# Patient Record
Sex: Male | Born: 1995 | Race: White | Hispanic: No | Marital: Single | Smoking: Never smoker
Health system: Southern US, Community
[De-identification: ages and names within clinical notes are randomized; demographics above are authoritative.]

## PROBLEM LIST (undated history)

## (undated) DIAGNOSIS — G2581 Restless legs syndrome: Secondary | ICD-10-CM

## (undated) HISTORY — PX: OTHER SURGICAL HISTORY: SHX169

## (undated) HISTORY — PX: CHOLECYSTECTOMY: SHX55

---

## 2016-02-02 DIAGNOSIS — Z1389 Encounter for screening for other disorder: Secondary | ICD-10-CM | POA: Diagnosis not present

## 2016-02-02 DIAGNOSIS — Z23 Encounter for immunization: Secondary | ICD-10-CM | POA: Diagnosis not present

## 2016-02-02 DIAGNOSIS — Z0000 Encounter for general adult medical examination without abnormal findings: Secondary | ICD-10-CM | POA: Diagnosis not present

## 2016-07-19 DIAGNOSIS — Z23 Encounter for immunization: Secondary | ICD-10-CM | POA: Diagnosis not present

## 2016-09-14 DIAGNOSIS — G2581 Restless legs syndrome: Secondary | ICD-10-CM | POA: Diagnosis not present

## 2016-09-14 DIAGNOSIS — G253 Myoclonus: Secondary | ICD-10-CM | POA: Diagnosis not present

## 2016-09-14 DIAGNOSIS — S43412A Sprain of left coracohumeral (ligament), initial encounter: Secondary | ICD-10-CM | POA: Diagnosis not present

## 2016-09-14 DIAGNOSIS — R3129 Other microscopic hematuria: Secondary | ICD-10-CM | POA: Diagnosis not present

## 2016-09-14 DIAGNOSIS — Z13220 Encounter for screening for lipoid disorders: Secondary | ICD-10-CM | POA: Diagnosis not present

## 2017-01-27 ENCOUNTER — Encounter: Payer: Self-pay | Admitting: Emergency Medicine

## 2017-01-27 ENCOUNTER — Emergency Department
Admission: EM | Admit: 2017-01-27 | Discharge: 2017-01-27 | Disposition: A | Discharge status: Home / Self Care | Payer: BLUE CROSS/BLUE SHIELD | Attending: Emergency Medicine | Admitting: Emergency Medicine

## 2017-01-27 ENCOUNTER — Emergency Department: Payer: BLUE CROSS/BLUE SHIELD

## 2017-01-27 DIAGNOSIS — N200 Calculus of kidney: Secondary | ICD-10-CM | POA: Insufficient documentation

## 2017-01-27 DIAGNOSIS — R319 Hematuria, unspecified: Secondary | ICD-10-CM | POA: Diagnosis not present

## 2017-01-27 DIAGNOSIS — R109 Unspecified abdominal pain: Secondary | ICD-10-CM | POA: Insufficient documentation

## 2017-01-27 DIAGNOSIS — K760 Fatty (change of) liver, not elsewhere classified: Secondary | ICD-10-CM | POA: Diagnosis not present

## 2017-01-27 HISTORY — DX: Restless legs syndrome: G25.81

## 2017-01-27 LAB — CBC
HEMATOCRIT: 45.9 % (ref 40.0–52.0)
HEMOGLOBIN: 15.5 g/dL (ref 13.0–18.0)
MCH: 29.6 pg (ref 26.0–34.0)
MCHC: 33.8 g/dL (ref 32.0–36.0)
MCV: 87.4 fL (ref 80.0–100.0)
Platelets: 222 10*3/uL (ref 150–440)
RBC: 5.24 MIL/uL (ref 4.40–5.90)
RDW: 14 % (ref 11.5–14.5)
WBC: 12.1 10*3/uL — ABNORMAL HIGH (ref 3.8–10.6)

## 2017-01-27 LAB — COMPREHENSIVE METABOLIC PANEL
ALBUMIN: 5.6 g/dL — AB (ref 3.5–5.0)
ALT: 78 U/L — ABNORMAL HIGH (ref 17–63)
ANION GAP: 10 (ref 5–15)
AST: 40 U/L (ref 15–41)
Alkaline Phosphatase: 65 U/L (ref 38–126)
BILIRUBIN TOTAL: 0.8 mg/dL (ref 0.3–1.2)
BUN: 15 mg/dL (ref 6–20)
CO2: 27 mmol/L (ref 22–32)
Calcium: 9.4 mg/dL (ref 8.9–10.3)
Chloride: 105 mmol/L (ref 101–111)
Creatinine, Ser: 0.85 mg/dL (ref 0.61–1.24)
GFR calc Af Amer: >60 mL/min (ref >60)
GFR calc non Af Amer: >60 mL/min (ref >60)
GLUCOSE: 92 mg/dL (ref 65–99)
POTASSIUM: 3.4 mmol/L — AB (ref 3.5–5.1)
SODIUM: 142 mmol/L (ref 135–145)
TOTAL PROTEIN: 8.8 g/dL — AB (ref 6.5–8.1)

## 2017-01-27 LAB — URINALYSIS, COMPLETE (UACMP) WITH MICROSCOPIC
BACTERIA UA: NONE SEEN
BILIRUBIN URINE: NEGATIVE
Glucose, UA: NEGATIVE mg/dL
KETONES UR: NEGATIVE mg/dL
Leukocytes, UA: NEGATIVE
NITRITE: NEGATIVE
PROTEIN: 30 mg/dL — AB
Specific Gravity, Urine: 1.021 (ref 1.005–1.030)
pH: 5 (ref 5.0–8.0)

## 2017-01-27 LAB — LIPASE, BLOOD: Lipase: 27 U/L (ref 11–51)

## 2017-01-27 LAB — CK: CK TOTAL: 309 U/L (ref 49–397)

## 2017-01-27 NOTE — ED Triage Notes (Signed)
Pt has lower left sided abd pain that radiates into groin that was uncomfortable all day and at 2300 started really hurting and has had 2 episodes of vomiting.

## 2017-01-27 NOTE — ED Provider Notes (Signed)
[facility] Emergency Department Provider Note   ____________________________________________   First MD Initiated Contact with Patient 01/27/17 0430     (approximate)  I have reviewed the triage vital signs and the nursing notes.   HISTORY  Chief Complaint Abdominal Pain    HPI Jared Stephens is a 21 y.o. male who comes into the hospital today with multiple episodes of sharp left mid abdomen pain. The patient reports it happened and then he vomited. It went away and then it occurred again. The patient reports that after it happened a second time he decided to come in and get checked out. He's had no fevers, chills, pain with urination. He said he did notice there was a black piece of sand in his urine after he gave the sample here. The patient had an episode of blood in his urine in March and had been diagnosed with rhabdo but hadn't had any problems with it since. When the pain comes at the 5-6 out of 10 but he has not had any pain since an hour after he gave his urine sample. The patient reports he has not been drinking much water and he does drink a lot of milk. He is here today for evaluation.   Past Medical History:  Diagnosis Date  . RLS (restless legs syndrome)     There are no active problems to display for this patient.   Past Surgical History:  Procedure Laterality Date  . CHOLECYSTECTOMY    . tear duct surgery      Prior to Admission medications   Not on File    Allergies Patient has no known allergies.  No family history on file.  Social History Social History  Substance Use Topics  . Smoking status: Never Smoker  . Smokeless tobacco: Not on file  . Alcohol use No    Review of Systems  Constitutional: No fever/chills Eyes: No visual changes. ENT: No sore throat. Cardiovascular: Denies chest pain. Respiratory: Denies shortness of breath. Gastrointestinal: abdominal pain, nausea, vomiting.  No diarrhea.  No  constipation. Genitourinary: Negative for dysuria. Musculoskeletal: Negative for back pain. Skin: Negative for rash. Neurological: Negative for headaches, focal weakness or numbness.   ____________________________________________   PHYSICAL EXAM:  VITAL SIGNS: ED Triage Vitals  Enc Vitals Group     BP 01/27/17 0023 (!) 146/89     Pulse Rate 01/27/17 0023 84     Resp 01/27/17 0023 19     Temp 01/27/17 0023 98.8 F (37.1 C)     Temp Source 01/27/17 0023 Oral     SpO2 01/27/17 0023 100 %     Weight 01/27/17 0025 270 lb (122.5 kg)     Height 01/27/17 0025 6' (1.829 m)     Head Circumference --      Peak Flow --      Pain Score 01/27/17 0023 3     Pain Loc --      Pain Edu? --      Excl. in GC? --     Constitutional: Alert and oriented. Well appearing and in no acute distress. Eyes: Conjunctivae are normal. PERRL. EOMI. Head: Atraumatic. Nose: No congestion/rhinnorhea. Mouth/Throat: Mucous membranes are moist.  Oropharynx non-erythematous. Cardiovascular: Normal rate, regular rhythm. Grossly normal heart sounds.  Good peripheral circulation. Respiratory: Normal respiratory effort.  No retractions. Lungs CTAB. Gastrointestinal: Soft and nontender. No distention. Positive bowel sounds Musculoskeletal: No lower extremity tenderness nor edema.   Neurologic:  Normal speech and language.  Skin:  Skin is warm, dry and intact. Marland Kitchen Psychiatric: Mood and affect are normal.   ____________________________________________   LABS (all labs ordered are listed, but only abnormal results are displayed)  Labs Reviewed  COMPREHENSIVE METABOLIC PANEL - Abnormal; Notable for the following:       Result Value   Potassium 3.4 (*)    Total Protein 8.8 (*)    Albumin 5.6 (*)    ALT 78 (*)    All other components within normal limits  CBC - Abnormal; Notable for the following:    WBC 12.1 (*)    All other components within normal limits  URINALYSIS, COMPLETE (UACMP) WITH MICROSCOPIC -  Abnormal; Notable for the following:    Color, Urine YELLOW (*)    APPearance HAZY (*)    Hgb urine dipstick LARGE (*)    Protein, ur 30 (*)    Squamous Epithelial / LPF 0-5 (*)    All other components within normal limits  LIPASE, BLOOD  CK   ____________________________________________  EKG  none ____________________________________________  RADIOLOGY  Ct Renal Stone Study  Result Date: 01/27/2017 CLINICAL DATA:  21 year old male with left-sided abdominal pain. Evaluate for stone. EXAM: CT ABDOMEN AND PELVIS WITHOUT CONTRAST TECHNIQUE: Multidetector CT imaging of the abdomen and pelvis was performed following the standard protocol without IV contrast. COMPARISON:  None. FINDINGS: Evaluation of this exam is limited in the absence of intravenous contrast. Lower chest: The visualized lung bases are clear. No intra-abdominal free air or free fluid. Hepatobiliary: Diffuse fatty infiltration of the liver. No intrahepatic biliary ductal dilatation. The gallbladder is surgically absent. Pancreas: Unremarkable. No pancreatic ductal dilatation or surrounding inflammatory changes. Spleen: Normal in size without focal abnormality. Adrenals/Urinary Tract: Adrenal glands are unremarkable. Kidneys are normal, without renal calculi, focal lesion, or hydronephrosis. Bladder is unremarkable. Stomach/Bowel: Moderate stool throughout the colon. No bowel obstruction or active inflammation. Normal appendix. Vascular/Lymphatic: The abdominal aorta and IVC are grossly unremarkable on this noncontrast study. No portal venous gas identified. There is no adenopathy. Reproductive: The prostate and seminal vesicles appear unremarkable. No pelvic mass. Other: None Musculoskeletal: No acute or significant osseous findings. IMPRESSION: 1. No acute intra-abdominopelvic pathology. No hydronephrosis or nephrolithiasis. 2. Fatty liver. Electronically Signed   By: Elgie Collard M.D.   On: 01/27/2017 05:02     ____________________________________________   PROCEDURES  Procedure(s) performed: None  Procedures  Critical Care performed: No  ____________________________________________   INITIAL IMPRESSION / ASSESSMENT AND PLAN / ED COURSE  Pertinent labs & imaging results that were available during my care of the patient were reviewed by me and considered in my medical decision making (see chart for details).  This is a 21 year old male who comes into the hospital today with some mid abdomen pain and vomiting. The patient has too numerous to count red blood cells in his urine with a concern for possible kidney stone. The patient reports though that when he urinated here there was a piece of sand that was black in his urine. I did send the patient for a CT scan to evaluate for kidney stones or hydronephrosis CT was unremarkable. It is possible that the patient may have passed a kidney stone and is why his pain is now improved. Given the patient's history of rhabdomyolysis did send a CK and it was unremarkable. The patient at this time has no complaints or concerns. I will have the patient follow back up with his primary care physician for further evaluation. The patient has  no further complaints or concerns. He will be discharged to home.      ____________________________________________   FINAL CLINICAL IMPRESSION(S) / ED DIAGNOSES  Final diagnoses:  Abdominal pain, unspecified abdominal location  Hematuria, unspecified type  Kidney stone      NEW MEDICATIONS STARTED DURING THIS VISIT:  There are no discharge medications for this patient.    Note:  This document was prepared using Dragon voice recognition software and may include unintentional dictation errors.    Rebecka ApleyWebster, Allison P, MD 01/27/17 [phone]

## 2017-11-29 DIAGNOSIS — G253 Myoclonus: Secondary | ICD-10-CM | POA: Diagnosis not present

## 2017-11-29 DIAGNOSIS — Z6841 Body Mass Index (BMI) 40.0 and over, adult: Secondary | ICD-10-CM | POA: Diagnosis not present

## 2017-11-29 DIAGNOSIS — G2581 Restless legs syndrome: Secondary | ICD-10-CM | POA: Diagnosis not present

## 2017-11-29 DIAGNOSIS — Z1331 Encounter for screening for depression: Secondary | ICD-10-CM | POA: Diagnosis not present

## 2017-12-03 ENCOUNTER — Other Ambulatory Visit: Payer: Self-pay

## 2017-12-03 ENCOUNTER — Emergency Department: Payer: BLUE CROSS/BLUE SHIELD

## 2017-12-03 ENCOUNTER — Encounter: Payer: Self-pay | Admitting: Emergency Medicine

## 2017-12-03 ENCOUNTER — Emergency Department
Admission: EM | Admit: 2017-12-03 | Discharge: 2017-12-03 | Disposition: A | Discharge status: Home / Self Care | Payer: BLUE CROSS/BLUE SHIELD | Attending: Emergency Medicine | Admitting: Emergency Medicine

## 2017-12-03 DIAGNOSIS — N200 Calculus of kidney: Secondary | ICD-10-CM | POA: Insufficient documentation

## 2017-12-03 DIAGNOSIS — R1110 Vomiting, unspecified: Secondary | ICD-10-CM | POA: Diagnosis not present

## 2017-12-03 DIAGNOSIS — N50811 Right testicular pain: Secondary | ICD-10-CM | POA: Diagnosis present

## 2017-12-03 DIAGNOSIS — N23 Unspecified renal colic: Secondary | ICD-10-CM | POA: Insufficient documentation

## 2017-12-03 LAB — CBC WITH DIFFERENTIAL/PLATELET
BASOS ABS: 0 10*3/uL (ref 0–0.1)
Basophils Relative: 0 %
EOS PCT: 1 %
Eosinophils Absolute: 0.1 10*3/uL (ref 0–0.7)
HCT: 43.6 % (ref 40.0–52.0)
Hemoglobin: 15 g/dL (ref 13.0–18.0)
LYMPHS PCT: 18 %
Lymphs Abs: 1.4 10*3/uL (ref 1.0–3.6)
MCH: 30.3 pg (ref 26.0–34.0)
MCHC: 34.5 g/dL (ref 32.0–36.0)
MCV: 87.9 fL (ref 80.0–100.0)
Monocytes Absolute: 0.7 10*3/uL (ref 0.2–1.0)
Monocytes Relative: 9 %
Neutro Abs: 6 10*3/uL (ref 1.4–6.5)
Neutrophils Relative %: 72 %
PLATELETS: 216 10*3/uL (ref 150–440)
RBC: 4.97 MIL/uL (ref 4.40–5.90)
RDW: 13.9 % (ref 11.5–14.5)
WBC: 8.3 10*3/uL (ref 3.8–10.6)

## 2017-12-03 LAB — URINALYSIS, COMPLETE (UACMP) WITH MICROSCOPIC
Bacteria, UA: NONE SEEN
Bilirubin Urine: NEGATIVE
GLUCOSE, UA: NEGATIVE mg/dL
KETONES UR: NEGATIVE mg/dL
LEUKOCYTES UA: NEGATIVE
NITRITE: NEGATIVE
Protein, ur: 30 mg/dL — AB
Specific Gravity, Urine: 1.023 (ref 1.005–1.030)
pH: 5 (ref 5.0–8.0)

## 2017-12-03 LAB — BASIC METABOLIC PANEL
ANION GAP: 6 (ref 5–15)
BUN: 11 mg/dL (ref 6–20)
CO2: 29 mmol/L (ref 22–32)
Calcium: 8.9 mg/dL (ref 8.9–10.3)
Chloride: 102 mmol/L (ref 101–111)
Creatinine, Ser: 0.9 mg/dL (ref 0.61–1.24)
GLUCOSE: 96 mg/dL (ref 65–99)
Potassium: 3.5 mmol/L (ref 3.5–5.1)
Sodium: 137 mmol/L (ref 135–145)

## 2017-12-03 NOTE — ED Notes (Addendum)
Urine sample sent at this time. Appears to have small stone in urine collection cup.

## 2017-12-03 NOTE — ED Provider Notes (Signed)
[facility] Emergency Department Provider Note       Time seen: ----------------------------------------- 9:34 AM on 12/03/2017 -----------------------------------------   I have reviewed the triage vital signs and the nursing notes.  HISTORY   Chief Complaint Testicle Pain    HPI Jared Stephens is a 22 y.o. male with a history of restless leg syndrome who presents to the ED for right-sided testicular pain that started last night with some associated right flank pain.  Patient now reports vomiting.  Pain was there last night and worse this morning but it appears to have all but resolved at this point.  He reports having a kidney stone in the distant past.  He denies significant pain at this time, states the right testicle is not tender or swollen.  Past Medical History:  Diagnosis Date  . RLS (restless legs syndrome)     There are no active problems to display for this patient.   Past Surgical History:  Procedure Laterality Date  . CHOLECYSTECTOMY    . tear duct surgery      Allergies Patient has no known allergies.  Social History Social History   Tobacco Use  . Smoking status: Never Smoker  . Smokeless tobacco: Never Used  Substance Use Topics  . Alcohol use: No  . Drug use: No   Review of Systems Constitutional: Negative for fever. Cardiovascular: Negative for chest pain. Respiratory: Negative for shortness of breath. Gastrointestinal: Positive for flank pain Genitourinary: Positive for testicular pain Musculoskeletal: Negative for back pain. Skin: Negative for rash. Neurological: Negative for headaches, focal weakness or numbness.  All systems negative/normal/unremarkable except as stated in the HPI  ____________________________________________   PHYSICAL EXAM:  VITAL SIGNS: ED Triage Vitals  Enc Vitals Group     BP 12/03/17 0930 (!) 136/94     Pulse Rate 12/03/17 0930 (!) 109     Resp 12/03/17 0930 18     Temp 12/03/17  0930 98.2 F (36.8 C)     Temp Source 12/03/17 0930 Oral     SpO2 12/03/17 0930 100 %     Weight 12/03/17 0925 (!) 305 lb (138.3 kg)     Height 12/03/17 0925 6' (1.829 m)     Head Circumference --      Peak Flow --      Pain Score 12/03/17 0925 4     Pain Loc --      Pain Edu? --      Excl. in GC? --    Constitutional: Alert and oriented. Well appearing and in no distress. Eyes: Conjunctivae are normal. Normal extraocular movements. ENT   Head: Normocephalic and atraumatic.   Nose: No congestion/rhinnorhea.   Mouth/Throat: Mucous membranes are moist.   Neck: No stridor. Cardiovascular: Normal rate, regular rhythm. No murmurs, rubs, or gallops. Respiratory: Normal respiratory effort without tachypnea nor retractions. Breath sounds are clear and equal bilaterally. No wheezes/rales/rhonchi. Gastrointestinal: Soft and nontender. Normal bowel sounds Genitourinary: No hernia, patient examined standing, no testicular tenderness or swelling, normal lie. Musculoskeletal: Nontender with normal range of motion in extremities. No lower extremity tenderness nor edema. Neurologic:  Normal speech and language. No gross focal neurologic deficits are appreciated.  Skin:  Skin is warm, dry and intact. No rash noted. Psychiatric: Mood and affect are normal. Speech and behavior are normal.  ____________________________________________  ED COURSE:  As part of my medical decision making, I reviewed the following data within the electronic MEDICAL RECORD NUMBER History obtained from family if available, nursing  notes, old chart and ekg, as well as notes from prior ED visits. Patient presented for flank and testicular pain, we will assess with labs and imaging as indicated at this time.   Procedures ____________________________________________   LABS (pertinent positives/negatives)  Labs Reviewed  URINALYSIS, COMPLETE (UACMP) WITH MICROSCOPIC - Abnormal; Notable for the following components:       Result Value   Color, Urine YELLOW (*)    APPearance HAZY (*)    Hgb urine dipstick LARGE (*)    Protein, ur 30 (*)    RBC / HPF >50 (*)    All other components within normal limits  CBC WITH DIFFERENTIAL/PLATELET  BASIC METABOLIC PANEL    RADIOLOGY Images were viewed by me  KUB Is unremarkable ____________________________________________  DIFFERENTIAL DIAGNOSIS   Renal colic, UTI, pyelonephritis, muscle strain, torsion unlikely  FINAL ASSESSMENT AND PLAN  Renal colic   Plan: The patient had presented for symptoms of a kidney stone. Patient's labs did reveal hematuria consistent with renal colic. Patient's imaging was negative, because he passed his kidney stone while giving his urine sample in the ER.  He is cleared for outpatient follow-up.   Ulice Dash, MD   Note: This note was generated in part or whole with voice recognition software. Voice recognition is usually quite accurate but there are transcription errors that can and very often do occur. I apologize for any typographical errors that were not detected and corrected.     Emily Filbert, MD 12/03/17 [phone]

## 2017-12-03 NOTE — ED Triage Notes (Signed)
Pt to ed with c/o right testicular pain that started last night, worse this am and associated with vomiting.

## 2017-12-03 NOTE — ED Notes (Signed)
Pt presents today r testicular pain that started yesterday and has worsened through the night. Pt now reports vomiting. EDP at bedside.

## 2018-07-06 DIAGNOSIS — Z23 Encounter for immunization: Secondary | ICD-10-CM | POA: Diagnosis not present

## 2018-07-06 DIAGNOSIS — G253 Myoclonus: Secondary | ICD-10-CM | POA: Diagnosis not present

## 2018-07-06 DIAGNOSIS — Z13220 Encounter for screening for lipoid disorders: Secondary | ICD-10-CM | POA: Diagnosis not present

## 2018-07-06 DIAGNOSIS — Z0000 Encounter for general adult medical examination without abnormal findings: Secondary | ICD-10-CM | POA: Diagnosis not present

## 2018-07-06 DIAGNOSIS — G2581 Restless legs syndrome: Secondary | ICD-10-CM | POA: Diagnosis not present

## 2018-07-07 DIAGNOSIS — R748 Abnormal levels of other serum enzymes: Secondary | ICD-10-CM | POA: Diagnosis not present

## 2018-07-11 DIAGNOSIS — K7689 Other specified diseases of liver: Secondary | ICD-10-CM | POA: Diagnosis not present

## 2018-07-11 DIAGNOSIS — R748 Abnormal levels of other serum enzymes: Secondary | ICD-10-CM | POA: Diagnosis not present

## 2018-07-24 IMAGING — CT CT RENAL STONE PROTOCOL
2 of 4 series · 16 of 46 positions shown, 18 images · non-contrast
Comparison: None.

CLINICAL DATA: 20-year-old male with left-sided abdominal pain.
Evaluate for stone.

EXAM:
CT ABDOMEN AND PELVIS WITHOUT CONTRAST
TECHNIQUE: Multidetector CT imaging of the abdomen and pelvis was performed
following the standard protocol without IV contrast.

[Series 2: stone full standard · axial · 0.88mm/px · z∈[-872,-382]mm · 13 of 108 slices shown, 15 images]
[im 5/108  soft-tissue]
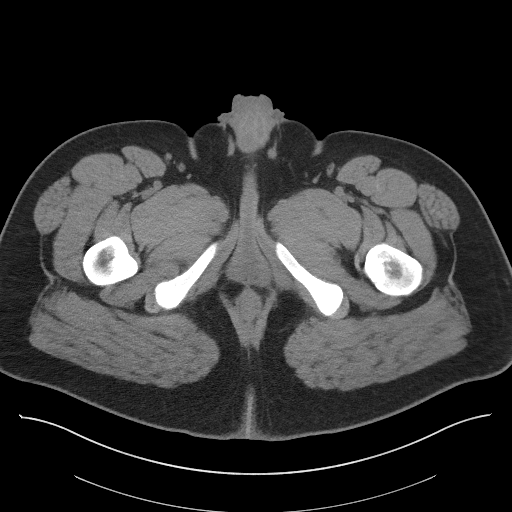
[im 5/108  bone]
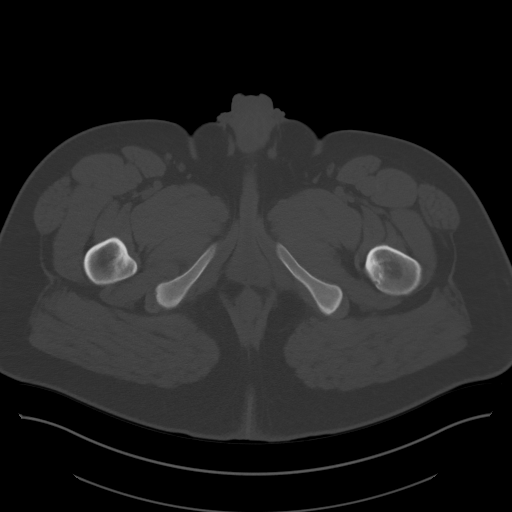
[im 13/108  soft-tissue]
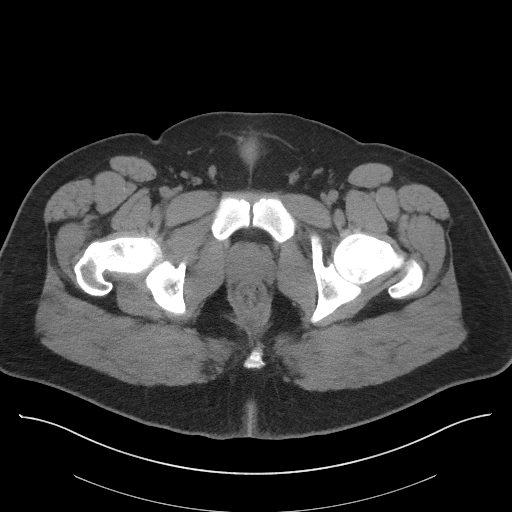
[im 22/108  soft-tissue]
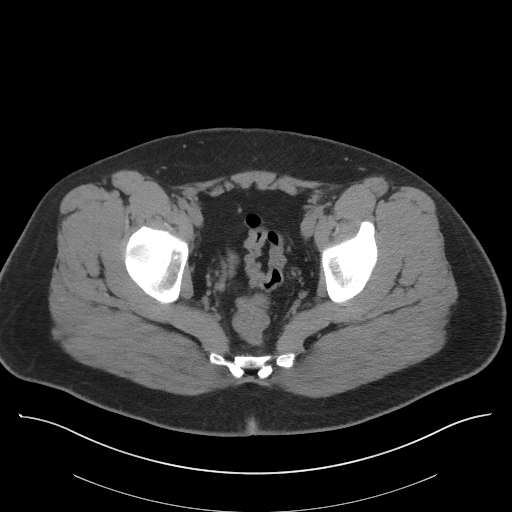
[im 30/108  soft-tissue]
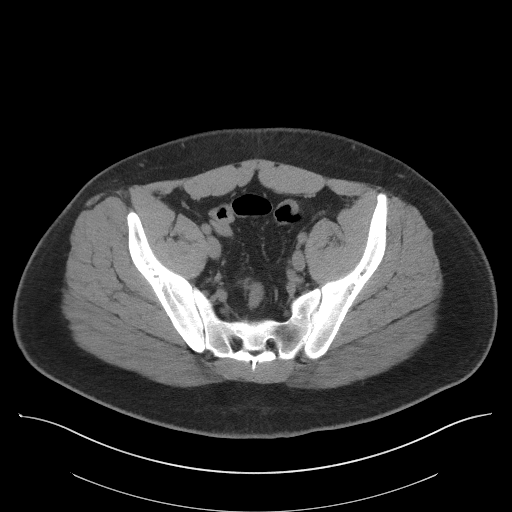
[im 39/108  soft-tissue]
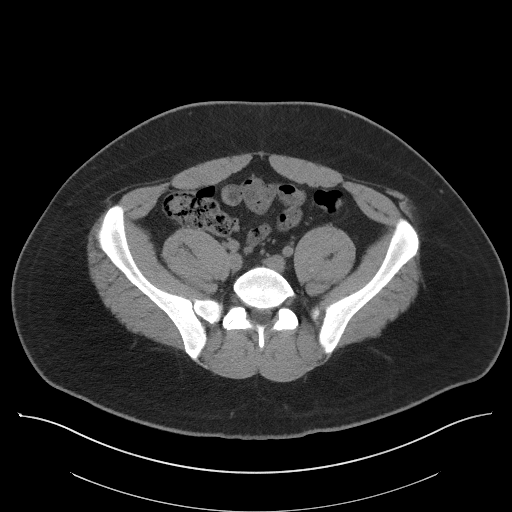
[im 48/108  soft-tissue]
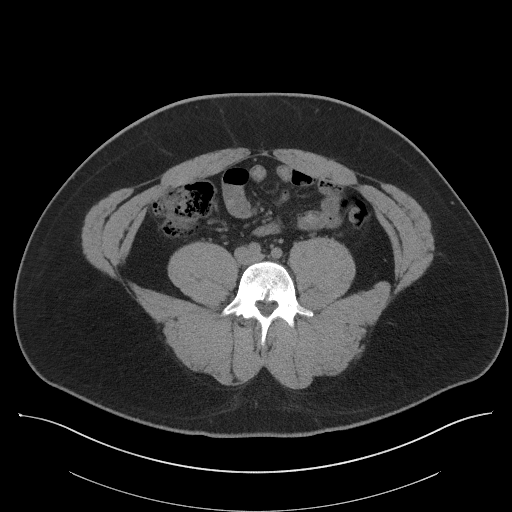
[im 56/108  soft-tissue]
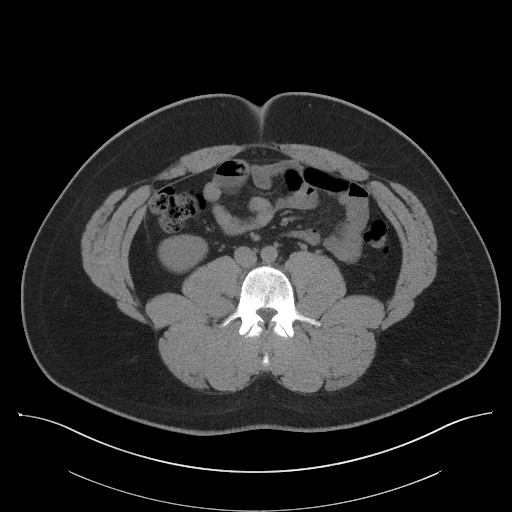
[im 60/108  soft-tissue]
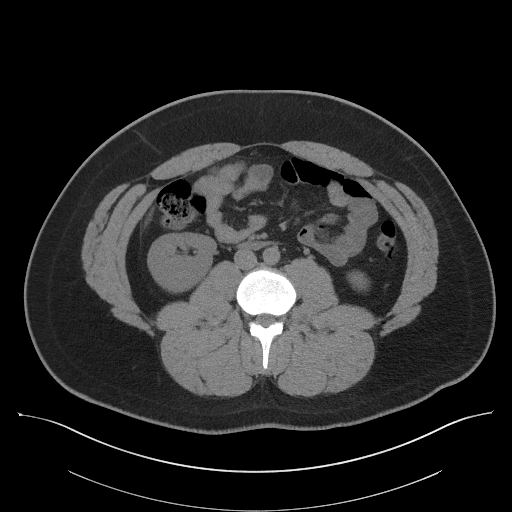
[im 69/108  soft-tissue]
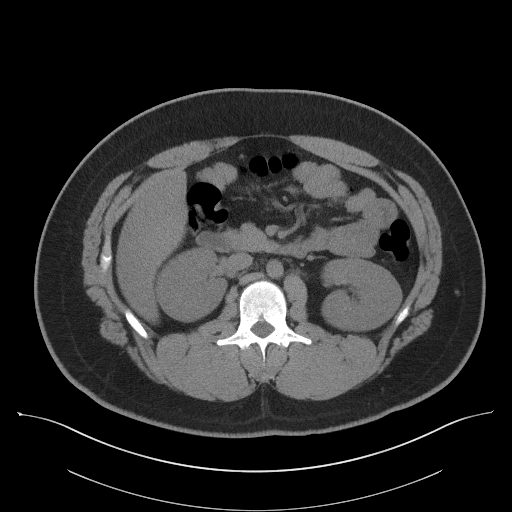
[im 69/108  bone]
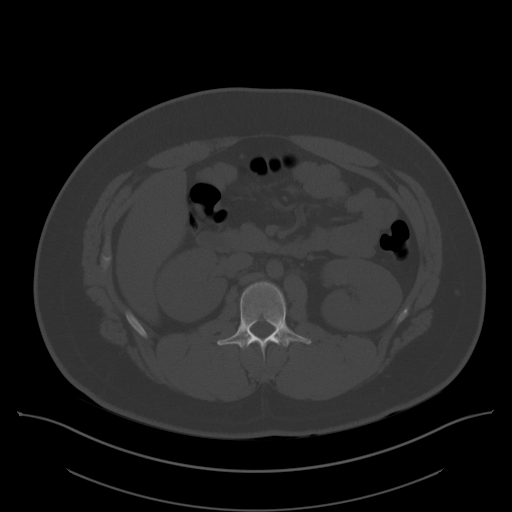
[im 78/108  soft-tissue]
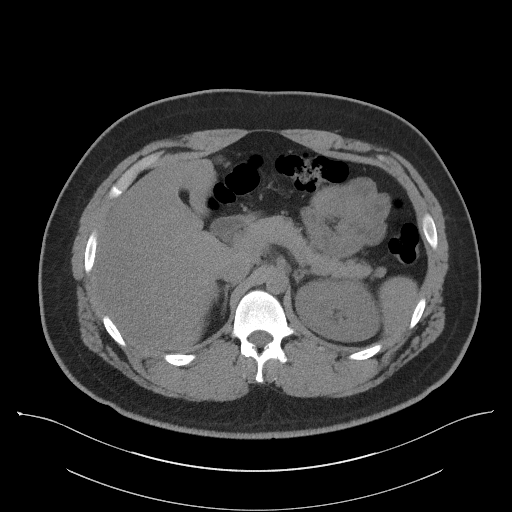
[im 86/108  soft-tissue]
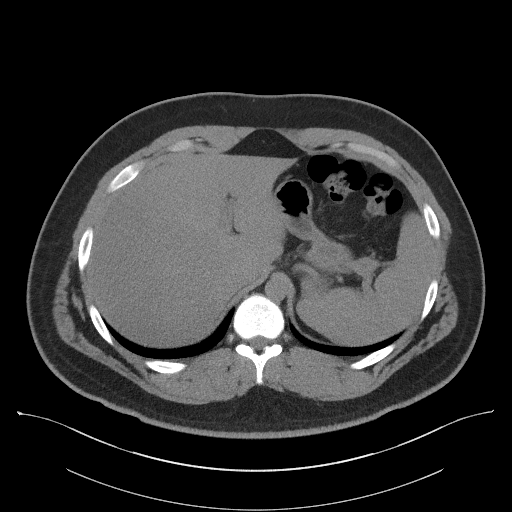
[im 95/108  soft-tissue]
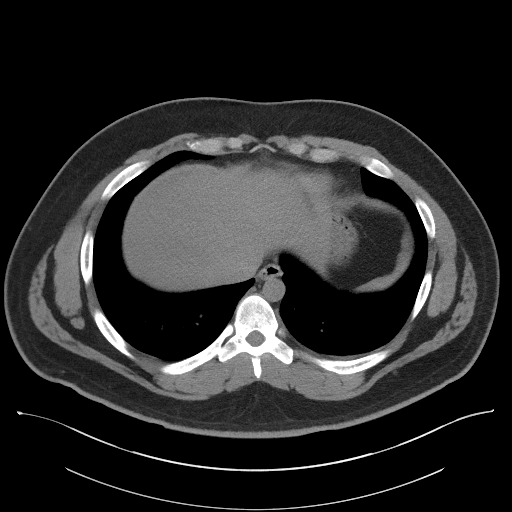
[im 103/108  soft-tissue]
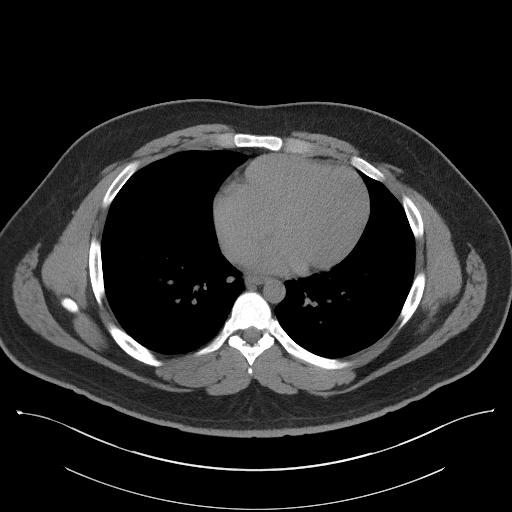

[Series 5: coronal · coronal · 0.91mm/px · 3 of 147 slices shown]
[im 49/147  soft-tissue]
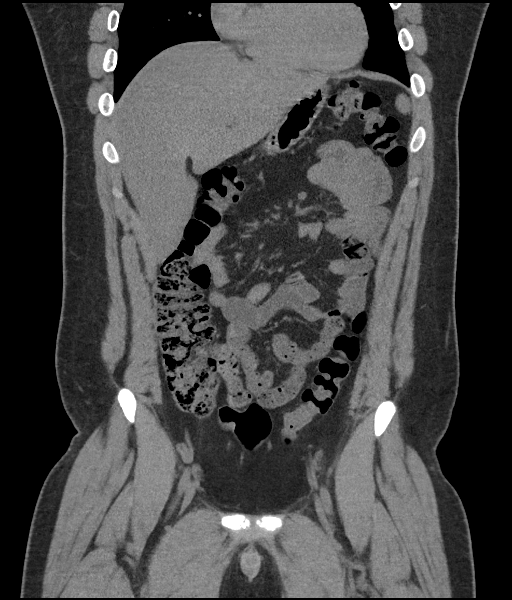
[im 65/147  soft-tissue]
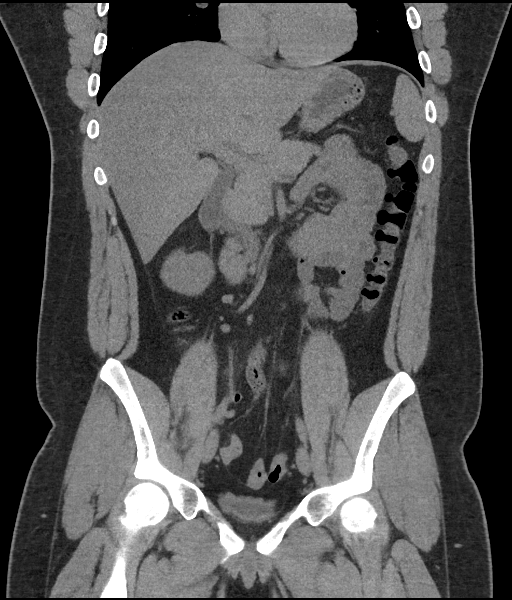
[im 82/147  soft-tissue]
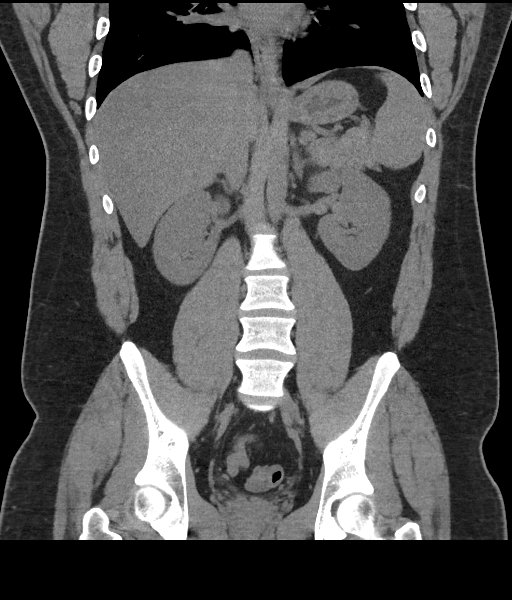

[16 of 46 positions shown; findings below may reference images not displayed]

FINDINGS: Evaluation of this exam is limited in the absence of intravenous
contrast.

Lower chest: The visualized lung bases are clear.

No intra-abdominal free air or free fluid.

Hepatobiliary: Diffuse fatty infiltration of the liver. No
intrahepatic biliary ductal dilatation. The gallbladder is
surgically absent.

Pancreas: Unremarkable. No pancreatic ductal dilatation or
surrounding inflammatory changes.

Spleen: Normal in size without focal abnormality.

Adrenals/Urinary Tract: Adrenal glands are unremarkable. Kidneys are
normal, without renal calculi, focal lesion, or hydronephrosis.
Bladder is unremarkable.

Stomach/Bowel: Moderate stool throughout the colon. No bowel
obstruction or active inflammation. Normal appendix.

Vascular/Lymphatic: The abdominal aorta and IVC are grossly
unremarkable on this noncontrast study. No portal venous gas
identified. There is no adenopathy.

Reproductive: The prostate and seminal vesicles appear unremarkable.
No pelvic mass.

Other: None

Musculoskeletal: No acute or significant osseous findings.
IMPRESSION: 1. No acute intra-abdominopelvic pathology. No hydronephrosis or
nephrolithiasis.
2. Fatty liver.

## 2018-09-21 DIAGNOSIS — J209 Acute bronchitis, unspecified: Secondary | ICD-10-CM | POA: Diagnosis not present

## 2018-10-18 DIAGNOSIS — G2581 Restless legs syndrome: Secondary | ICD-10-CM | POA: Diagnosis not present

## 2018-10-18 DIAGNOSIS — G253 Myoclonus: Secondary | ICD-10-CM | POA: Diagnosis not present

## 2018-10-18 DIAGNOSIS — K760 Fatty (change of) liver, not elsewhere classified: Secondary | ICD-10-CM | POA: Diagnosis not present

## 2019-09-28 DIAGNOSIS — Z0000 Encounter for general adult medical examination without abnormal findings: Secondary | ICD-10-CM | POA: Diagnosis not present

## 2019-09-28 DIAGNOSIS — K760 Fatty (change of) liver, not elsewhere classified: Secondary | ICD-10-CM | POA: Diagnosis not present

## 2019-09-28 DIAGNOSIS — Z131 Encounter for screening for diabetes mellitus: Secondary | ICD-10-CM | POA: Diagnosis not present

## 2019-09-28 DIAGNOSIS — Z13220 Encounter for screening for lipoid disorders: Secondary | ICD-10-CM | POA: Diagnosis not present

## 2019-09-28 DIAGNOSIS — Z1331 Encounter for screening for depression: Secondary | ICD-10-CM | POA: Diagnosis not present

## 2019-10-01 DIAGNOSIS — Z79899 Other long term (current) drug therapy: Secondary | ICD-10-CM | POA: Diagnosis not present

## 2019-10-01 DIAGNOSIS — R030 Elevated blood-pressure reading, without diagnosis of hypertension: Secondary | ICD-10-CM | POA: Diagnosis not present

## 2019-10-03 DIAGNOSIS — S8991XA Unspecified injury of right lower leg, initial encounter: Secondary | ICD-10-CM | POA: Diagnosis not present

## 2019-10-03 DIAGNOSIS — M25561 Pain in right knee: Secondary | ICD-10-CM | POA: Diagnosis not present

## 2019-10-03 DIAGNOSIS — W010XXA Fall on same level from slipping, tripping and stumbling without subsequent striking against object, initial encounter: Secondary | ICD-10-CM | POA: Diagnosis not present

## 2019-10-03 DIAGNOSIS — W19XXXA Unspecified fall, initial encounter: Secondary | ICD-10-CM | POA: Diagnosis not present

## 2019-10-03 DIAGNOSIS — M25461 Effusion, right knee: Secondary | ICD-10-CM | POA: Diagnosis not present

## 2019-10-03 DIAGNOSIS — M25411 Effusion, right shoulder: Secondary | ICD-10-CM | POA: Diagnosis not present

## 2019-10-05 DIAGNOSIS — S83004A Unspecified dislocation of right patella, initial encounter: Secondary | ICD-10-CM | POA: Diagnosis not present

## 2019-10-05 DIAGNOSIS — W1839XA Other fall on same level, initial encounter: Secondary | ICD-10-CM | POA: Diagnosis not present

## 2019-10-19 DIAGNOSIS — X58XXXA Exposure to other specified factors, initial encounter: Secondary | ICD-10-CM | POA: Diagnosis not present

## 2019-10-19 DIAGNOSIS — M7121 Synovial cyst of popliteal space [Baker], right knee: Secondary | ICD-10-CM | POA: Diagnosis not present

## 2019-10-19 DIAGNOSIS — M2391 Unspecified internal derangement of right knee: Secondary | ICD-10-CM | POA: Diagnosis not present

## 2019-10-19 DIAGNOSIS — K760 Fatty (change of) liver, not elsewhere classified: Secondary | ICD-10-CM | POA: Diagnosis not present

## 2019-10-19 DIAGNOSIS — S83014A Lateral dislocation of right patella, initial encounter: Secondary | ICD-10-CM | POA: Diagnosis not present

## 2019-10-24 DIAGNOSIS — M2391 Unspecified internal derangement of right knee: Secondary | ICD-10-CM | POA: Diagnosis not present

## 2019-10-24 DIAGNOSIS — S83004A Unspecified dislocation of right patella, initial encounter: Secondary | ICD-10-CM | POA: Diagnosis not present

## 2019-11-09 DIAGNOSIS — M25561 Pain in right knee: Secondary | ICD-10-CM | POA: Diagnosis not present

## 2019-11-09 DIAGNOSIS — M6281 Muscle weakness (generalized): Secondary | ICD-10-CM | POA: Diagnosis not present

## 2019-11-15 DIAGNOSIS — M25561 Pain in right knee: Secondary | ICD-10-CM | POA: Diagnosis not present

## 2019-11-15 DIAGNOSIS — M6281 Muscle weakness (generalized): Secondary | ICD-10-CM | POA: Diagnosis not present

## 2019-11-19 DIAGNOSIS — M25561 Pain in right knee: Secondary | ICD-10-CM | POA: Diagnosis not present

## 2019-11-19 DIAGNOSIS — M6281 Muscle weakness (generalized): Secondary | ICD-10-CM | POA: Diagnosis not present

## 2019-11-22 DIAGNOSIS — M6281 Muscle weakness (generalized): Secondary | ICD-10-CM | POA: Diagnosis not present

## 2019-11-22 DIAGNOSIS — M25561 Pain in right knee: Secondary | ICD-10-CM | POA: Diagnosis not present

## 2019-11-27 DIAGNOSIS — M6281 Muscle weakness (generalized): Secondary | ICD-10-CM | POA: Diagnosis not present

## 2019-11-27 DIAGNOSIS — M25561 Pain in right knee: Secondary | ICD-10-CM | POA: Diagnosis not present

## 2019-11-29 DIAGNOSIS — M6281 Muscle weakness (generalized): Secondary | ICD-10-CM | POA: Diagnosis not present

## 2019-11-29 DIAGNOSIS — M25561 Pain in right knee: Secondary | ICD-10-CM | POA: Diagnosis not present

## 2019-12-05 DIAGNOSIS — S83004D Unspecified dislocation of right patella, subsequent encounter: Secondary | ICD-10-CM | POA: Diagnosis not present

## 2019-12-12 DIAGNOSIS — M6281 Muscle weakness (generalized): Secondary | ICD-10-CM | POA: Diagnosis not present

## 2019-12-12 DIAGNOSIS — M25561 Pain in right knee: Secondary | ICD-10-CM | POA: Diagnosis not present

## 2019-12-19 DIAGNOSIS — M25561 Pain in right knee: Secondary | ICD-10-CM | POA: Diagnosis not present

## 2019-12-19 DIAGNOSIS — M6281 Muscle weakness (generalized): Secondary | ICD-10-CM | POA: Diagnosis not present

## 2019-12-27 DIAGNOSIS — M25561 Pain in right knee: Secondary | ICD-10-CM | POA: Diagnosis not present

## 2019-12-27 DIAGNOSIS — M6281 Muscle weakness (generalized): Secondary | ICD-10-CM | POA: Diagnosis not present

## 2020-01-03 DIAGNOSIS — M25561 Pain in right knee: Secondary | ICD-10-CM | POA: Diagnosis not present

## 2020-01-03 DIAGNOSIS — M6281 Muscle weakness (generalized): Secondary | ICD-10-CM | POA: Diagnosis not present

## 2020-01-14 DIAGNOSIS — S83004D Unspecified dislocation of right patella, subsequent encounter: Secondary | ICD-10-CM | POA: Diagnosis not present
